# Patient Record
Sex: Female | Born: 1995 | Race: Black or African American | Hispanic: No | Marital: Single | Smoking: Never smoker
Health system: Southern US, Community
[De-identification: ages and names within clinical notes are randomized; demographics above are authoritative.]

## PROBLEM LIST (undated history)

## (undated) HISTORY — PX: BREAST SURGERY: SHX581

---

## 2018-04-27 ENCOUNTER — Encounter (HOSPITAL_BASED_OUTPATIENT_CLINIC_OR_DEPARTMENT_OTHER): Payer: Self-pay

## 2018-04-27 ENCOUNTER — Other Ambulatory Visit: Payer: Self-pay

## 2018-04-27 ENCOUNTER — Emergency Department (HOSPITAL_BASED_OUTPATIENT_CLINIC_OR_DEPARTMENT_OTHER)
Admission: EM | Admit: 2018-04-27 | Discharge: 2018-04-28 | Disposition: A | Discharge status: Home / Self Care | Payer: Self-pay | Attending: Emergency Medicine | Admitting: Emergency Medicine

## 2018-04-27 DIAGNOSIS — O219 Vomiting of pregnancy, unspecified: Secondary | ICD-10-CM | POA: Insufficient documentation

## 2018-04-27 DIAGNOSIS — R112 Nausea with vomiting, unspecified: Secondary | ICD-10-CM

## 2018-04-27 DIAGNOSIS — Z209 Contact with and (suspected) exposure to unspecified communicable disease: Secondary | ICD-10-CM | POA: Insufficient documentation

## 2018-04-27 DIAGNOSIS — Z3201 Encounter for pregnancy test, result positive: Secondary | ICD-10-CM | POA: Insufficient documentation

## 2018-04-27 DIAGNOSIS — Z3A08 8 weeks gestation of pregnancy: Secondary | ICD-10-CM | POA: Insufficient documentation

## 2018-04-27 LAB — URINALYSIS, MICROSCOPIC (REFLEX): WBC UA: NONE SEEN WBC/hpf (ref 0–5)

## 2018-04-27 LAB — URINALYSIS, ROUTINE W REFLEX MICROSCOPIC
Glucose, UA: NEGATIVE mg/dL
Hgb urine dipstick: NEGATIVE
Ketones, ur: 15 mg/dL — AB
Leukocytes, UA: NEGATIVE
NITRITE: NEGATIVE
PH: 6 (ref 5.0–8.0)
Protein, ur: 30 mg/dL — AB
SPECIFIC GRAVITY, URINE: 1.025 (ref 1.005–1.030)

## 2018-04-27 LAB — PREGNANCY, URINE: Preg Test, Ur: POSITIVE — AB

## 2018-04-27 NOTE — ED Triage Notes (Addendum)
C/o n/v x 4 days-LMP 9/25- pos home preg test this time-NAD-steady gait

## 2018-04-28 MED ORDER — ONDANSETRON 4 MG PO TBDP
4.0000 mg | ORAL_TABLET | Freq: Once | ORAL | Status: AC
  Administered 2018-04-28: 4 mg via ORAL
  Filled 2018-04-28: qty 1

## 2018-04-28 MED ORDER — METOCLOPRAMIDE HCL 5 MG PO TABS: 5.0000 mg | ORAL_TABLET | Freq: Four times a day (QID) | ORAL | 0 refills | Status: AC | PRN

## 2018-04-28 NOTE — ED Provider Notes (Signed)
[facility] EMERGENCY DEPARTMENT Provider Note  CSN: [id number] Arrival date & time: 04/27/18 2121  Chief Complaint(s) Emesis  HPI Glenda Lopez is a 22 y.o. female G3P1 (1 premature and 1 miscarrage) at [redacted]w[redacted]d by LMP.  The history is provided by the patient.  Emesis   This is a recurrent problem. Episode onset: 2 weeks. The problem occurs 5 to 10 times per day. The problem has been gradually worsening. The emesis has an appearance of stomach contents. There has been no fever. Associated symptoms include URI (resolved 2 weeks ago). Pertinent negatives include no abdominal pain, no chills, no cough, no diarrhea, no fever, no headaches and no myalgias. Risk factors include ill contacts.    Past Medical History History reviewed. No pertinent past medical history. There are no active problems to display for this patient.  Home Medication(s) Prior to Admission medications   Medication Sig Start Date End Date Taking? Authorizing Provider  metoCLOPramide (REGLAN) 5 MG tablet Take 1 tablet (5 mg total) by mouth every 6 (six) hours as needed for up to 10 days for nausea or vomiting. 04/28/18 05/08/18  Nira Conn, MD                                                                                                                                    Past Surgical History Past Surgical History:  Procedure Laterality Date  . BREAST SURGERY     Family History No family history on file.  Social History Social History   Tobacco Use  . Smoking status: Never Smoker  . Smokeless tobacco: Never Used  Substance Use Topics  . Alcohol use: Never    Frequency: Never  . Drug use: Never   Allergies Patient has no known allergies.  Review of Systems Review of Systems  Constitutional: Negative for chills and fever.  Respiratory: Negative for cough.   Gastrointestinal: Positive for vomiting. Negative for abdominal pain and diarrhea.  Musculoskeletal: Negative for myalgias.    Neurological: Negative for headaches.   All other systems are reviewed and are negative for acute change except as noted in the HPI  Physical Exam Vital Signs  I have reviewed the triage vital signs BP 116/66 (BP Location: Right Arm)   Pulse 73   Temp 98.9 F (37.2 C) (Oral)   Resp 16   Ht 5\' 5"  (1.651 m)   Wt 72.1 kg   LMP 02/25/2018   SpO2 100%   BMI 26.46 kg/m   Physical Exam  Constitutional: She is oriented to person, place, and time. She appears well-developed and well-nourished. No distress.  HENT:  Head: Normocephalic and atraumatic.  Right Ear: External ear normal.  Left Ear: External ear normal.  Nose: Nose normal.  Eyes: Conjunctivae and EOM are normal. No scleral icterus.  Neck: Normal range of motion and phonation normal.  Cardiovascular: Normal rate and regular rhythm.  Pulmonary/Chest: Effort normal. No stridor. No  respiratory distress.  Abdominal: She exhibits no distension. There is no tenderness. There is no rigidity, no rebound and no guarding.  Musculoskeletal: Normal range of motion. She exhibits no edema.  Neurological: She is alert and oriented to person, place, and time.  Skin: She is not diaphoretic.  Psychiatric: She has a normal mood and affect. Her behavior is normal.  Vitals reviewed.   ED Results and Treatments Labs (all labs ordered are listed, but only abnormal results are displayed) Labs Reviewed  URINALYSIS, ROUTINE W REFLEX MICROSCOPIC - Abnormal; Notable for the following components:      Result Value   Bilirubin Urine MODERATE (*)    Ketones, ur 15 (*)    Protein, ur 30 (*)    All other components within normal limits  PREGNANCY, URINE - Abnormal; Notable for the following components:   Preg Test, Ur POSITIVE (*)    All other components within normal limits  URINALYSIS, MICROSCOPIC (REFLEX) - Abnormal; Notable for the following components:   Bacteria, UA RARE (*)    All other components within normal limits                                                                                                                          EKG  EKG Interpretation  Date/Time:    Ventricular Rate:    PR Interval:    QRS Duration:   QT Interval:    QTC Calculation:   R Axis:     Text Interpretation:        Radiology No results found. Pertinent labs & imaging results that were available during my care of the patient were reviewed by me and considered in my medical decision making (see chart for details).  Medications Ordered in ED Medications  ondansetron (ZOFRAN-ODT) disintegrating tablet 4 mg (4 mg Oral Given 04/28/18 0019)                                                                                                                                    Procedures Procedures  (including critical care time)  Medical Decision Making / ED Course I have reviewed the nursing notes for this encounter and the patient's prior records (if available in EHR or on provided paperwork).    First trimester pregnancy at approximately 8 weeks and 6 days by last menstrual period.  Abdomen benign.  UA without evidence of infection.  Possibly pregnancy related versus viral process.  Doubt serious intra-abdominal inflammatory/infectious process.  Treated with antiemetics and able to tolerate oral intake.  The patient appears reasonably screened and/or stabilized for discharge and I doubt any other medical condition or other [facility] requiring further screening, evaluation, or treatment in the ED at this time prior to discharge.  The patient is safe for discharge with strict return precautions.   Final Clinical Impression(s) / ED Diagnoses Final diagnoses:  Nausea and vomiting in adult  [redacted] weeks gestation of pregnancy    Disposition: Discharge  Condition: Good  I have discussed the results, Dx and Tx plan with the patient who expressed understanding and agree(s) with the plan. Discharge instructions discussed at great length. The  patient was given strict return precautions who verbalized understanding of the instructions. No further questions at time of discharge.    ED Discharge Orders         Ordered    metoCLOPramide (REGLAN) 5 MG tablet  Every 6 hours PRN     04/28/18 0127           Follow Up: Obstetrician  Schedule an appointment as soon as possible for a visit  for prenatal care     This chart was dictated using voice recognition software.  Despite best efforts to proofread,  errors can occur which can change the documentation meaning.   Nira Conn, MD 04/28/18 [phone]

## 2018-04-28 NOTE — ED Notes (Signed)
Gave patient gingerale for oral hydration.

## 2018-04-30 ENCOUNTER — Emergency Department (HOSPITAL_BASED_OUTPATIENT_CLINIC_OR_DEPARTMENT_OTHER)
Admission: EM | Admit: 2018-04-30 | Discharge: 2018-04-30 | Disposition: A | Discharge status: Home / Self Care | Payer: Self-pay | Attending: Emergency Medicine | Admitting: Emergency Medicine

## 2018-04-30 ENCOUNTER — Other Ambulatory Visit: Payer: Self-pay

## 2018-04-30 ENCOUNTER — Encounter (HOSPITAL_BASED_OUTPATIENT_CLINIC_OR_DEPARTMENT_OTHER): Payer: Self-pay | Admitting: Emergency Medicine

## 2018-04-30 DIAGNOSIS — R102 Pelvic and perineal pain: Secondary | ICD-10-CM | POA: Insufficient documentation

## 2018-04-30 DIAGNOSIS — O4691 Antepartum hemorrhage, unspecified, first trimester: Secondary | ICD-10-CM | POA: Insufficient documentation

## 2018-04-30 DIAGNOSIS — Z3A08 8 weeks gestation of pregnancy: Secondary | ICD-10-CM | POA: Insufficient documentation

## 2018-04-30 DIAGNOSIS — O4690 Antepartum hemorrhage, unspecified, unspecified trimester: Secondary | ICD-10-CM

## 2018-04-30 LAB — CBC
HEMATOCRIT: 35.4 % — AB (ref 36.0–46.0)
Hemoglobin: 10.9 g/dL — ABNORMAL LOW (ref 12.0–15.0)
MCH: 26.5 pg (ref 26.0–34.0)
MCHC: 30.8 g/dL (ref 30.0–36.0)
MCV: 86.1 fL (ref 80.0–100.0)
PLATELETS: 225 10*3/uL (ref 150–400)
RBC: 4.11 MIL/uL (ref 3.87–5.11)
RDW: 15.8 % — AB (ref 11.5–15.5)
WBC: 6.7 10*3/uL (ref 4.0–10.5)
nRBC: 0 % (ref 0.0–0.2)

## 2018-04-30 LAB — WET PREP, GENITAL
Clue Cells Wet Prep HPF POC: NONE SEEN
SPERM: NONE SEEN
Trich, Wet Prep: NONE SEEN
YEAST WET PREP: NONE SEEN

## 2018-04-30 LAB — HCG, QUANTITATIVE, PREGNANCY: hCG, Beta Chain, Quant, S: 272320 m[IU]/mL — ABNORMAL HIGH (ref <5)

## 2018-04-30 NOTE — ED Triage Notes (Signed)
Patient states she woke around 0100 and noticed vaginal bleeding after using the bathroom; co abd cramping.

## 2018-04-30 NOTE — ED Provider Notes (Signed)
[facility] EMERGENCY DEPARTMENT Provider Note   CSN: [id number] Arrival date & time: 04/30/18  0201     History   Chief Complaint Chief Complaint  Patient presents with  . Vaginal Bleeding    HPI Glenda Lopez is a 22 y.o. female.  The history is provided by the patient.  Vaginal Bleeding  Primary symptoms include vaginal bleeding.  Primary symptoms include no dysuria. There has been no fever. This is a new problem. The current episode started 3 to 5 hours ago. The problem has been gradually improving. Associated symptoms include abdominal pain. Pertinent negatives include no vomiting. She has tried nothing for the symptoms. There is no concern regarding sexually transmitted diseases.  Patient presents for abdominal pain and cramping.  She reports while she was at work tonight, she has some cramping in some vaginal bleeding.  No fevers or vomiting.  No dysuria.  She feels that her bleeding is improving.  She is known pregnant, approximately 8 weeks by LMP.  She is a G3, P1.  She does not have a local OB as she just moved here  PMH-none Past Surgical History:  Procedure Laterality Date  . BREAST SURGERY       OB History   None      Home Medications    Prior to Admission medications   Medication Sig Start Date End Date Taking? Authorizing Provider  metoCLOPramide (REGLAN) 5 MG tablet Take 1 tablet (5 mg total) by mouth every 6 (six) hours as needed for up to 10 days for nausea or vomiting. 04/28/18 05/08/18  Nira Conn, MD    Family History No family history on file.  Social History Social History   Tobacco Use  . Smoking status: Never Smoker  . Smokeless tobacco: Never Used  Substance Use Topics  . Alcohol use: Never    Frequency: Never  . Drug use: Never     Allergies   Patient has no known allergies.   Review of Systems Review of Systems  Constitutional: Negative for fever.  Gastrointestinal: Positive for abdominal pain.  Negative for vomiting.  Genitourinary: Positive for vaginal bleeding. Negative for dysuria.  All other systems reviewed and are negative.    Physical Exam Updated Vital Signs BP 132/77 (BP Location: Right Arm)   Pulse 88   Temp 98.2 F (36.8 C) (Oral)   Resp 18   Ht 1.651 m (5\' 5" )   Wt 72.1 kg   LMP 02/25/2018   SpO2 100%   BMI 26.45 kg/m   Physical Exam CONSTITUTIONAL: Well developed/well nourished HEAD: Normocephalic/atraumatic EYES: EOMI/PERRL ENMT: Mucous membranes moist NECK: supple no meningeal signs SPINE/BACK:entire spine nontender CV: S1/S2 noted, no murmurs/rubs/gallops noted LUNGS: Lungs are clear to auscultation bilaterally, no apparent distress ABDOMEN: soft, nontender, no rebound or guarding, bowel sounds noted throughout abdomen GU:no cva tenderness,, no CMT, no vaginal bleeding, no vaginal discharge, no products of conception, nurse chaperone present for exam NEURO: Pt is awake/alert/appropriate, moves all extremitiesx4.  No facial droop.   EXTREMITIES: pulses normal/equal, full ROM SKIN: warm, color normal PSYCH: no abnormalities of mood noted, alert and oriented to situation   ED Treatments / Results  Labs (all labs ordered are listed, but only abnormal results are displayed) Labs Reviewed  WET PREP, GENITAL - Abnormal; Notable for the following components:      Result Value   WBC, Wet Prep HPF POC FEW (*)    All other components within normal limits  CBC - Abnormal; Notable  for the following components:   Hemoglobin 10.9 (*)    HCT 35.4 (*)    RDW 15.8 (*)    All other components within normal limits  HCG, QUANTITATIVE, PREGNANCY - Abnormal; Notable for the following components:   hCG, Beta Chain, Quant, S 272,320 (*)    All other components within normal limits  HIV ANTIBODY (ROUTINE TESTING W REFLEX)  GC/CHLAMYDIA PROBE AMP ([place]) NOT AT [facility]    EKG None  Radiology No results found.  Procedures Procedures  EMERGENCY  DEPARTMENT US PREGNANCY "Study: Limited Ultrasound of the Pelvis for Pregnancy"  INDICATIONS:Pregnancy(required) and Vaginal bleeding Multiple views of the uterus and pelvic cavity were obtained in real-time with a multi-frequency probe.  APPROACH:Transabdominal  PERFORMED BY: Myself IMAGES ARCHIVED?: Yes LIMITATIONS: Emergent procedure PREGNANCY FREE FLUID: None ADNEXAL FINDINGS: GESTATIONAL AGE, ESTIMATE: 8-9 weeks LMP FETAL HEART RATE: 115 INTERPRETATION: Yolk sac noted and Fetal heart activity seen      Medications Ordered in ED Medications - No data to display   Initial Impression / Assessment and Plan / ED Course  I have reviewed the triage vital signs and the nursing notes.  Pertinent labs  results that were available during my care of the patient were reviewed by me and considered in my medical decision making (see chart for details).     Patient presented with small amount of vaginal bleeding abdominal cramping.  Her symptoms have resolved at time of exam.  She was resting comfortably, sleeping on arrival to room.  Bedside ultrasound that was limited revealed IUP with fetal heart activity.  No other focal abdominal tenderness.  I feel she is appropriate for outpatient management.  She is been given numbers for OB/GYN care. She does have history of miscarriage previously, I discussed that a miscarriage is still possible and close follow-up with OB is in order  Final Clinical Impressions(s) / ED Diagnoses   Final diagnoses:  Vaginal bleeding in pregnancy    ED Discharge Orders    None       Zadie RhineWickline, Marquis Down, MD 04/30/18 [phone]

## 2018-05-01 LAB — HIV ANTIBODY (ROUTINE TESTING W REFLEX): HIV Screen 4th Generation wRfx: NONREACTIVE

## 2018-05-01 LAB — GC/CHLAMYDIA PROBE AMP (~~LOC~~) NOT AT ARMC
Chlamydia: POSITIVE — AB
NEISSERIA GONORRHEA: NEGATIVE

## 2018-06-18 ENCOUNTER — Encounter: Payer: Self-pay | Admitting: Family Medicine

## 2019-12-03 ENCOUNTER — Encounter (HOSPITAL_BASED_OUTPATIENT_CLINIC_OR_DEPARTMENT_OTHER): Payer: Self-pay | Admitting: *Deleted

## 2019-12-03 ENCOUNTER — Emergency Department (HOSPITAL_BASED_OUTPATIENT_CLINIC_OR_DEPARTMENT_OTHER): Payer: Medicaid Other

## 2019-12-03 ENCOUNTER — Emergency Department (HOSPITAL_BASED_OUTPATIENT_CLINIC_OR_DEPARTMENT_OTHER)
Admission: EM | Admit: 2019-12-03 | Discharge: 2019-12-03 | Disposition: A | Discharge status: Home / Self Care | Payer: Medicaid Other | Attending: Emergency Medicine | Admitting: Emergency Medicine

## 2019-12-03 ENCOUNTER — Other Ambulatory Visit: Payer: Self-pay

## 2019-12-03 DIAGNOSIS — Y939 Activity, unspecified: Secondary | ICD-10-CM | POA: Insufficient documentation

## 2019-12-03 DIAGNOSIS — S51811A Laceration without foreign body of right forearm, initial encounter: Secondary | ICD-10-CM | POA: Diagnosis not present

## 2019-12-03 DIAGNOSIS — Y92009 Unspecified place in unspecified non-institutional (private) residence as the place of occurrence of the external cause: Secondary | ICD-10-CM | POA: Insufficient documentation

## 2019-12-03 DIAGNOSIS — Y999 Unspecified external cause status: Secondary | ICD-10-CM | POA: Diagnosis not present

## 2019-12-03 DIAGNOSIS — S61212A Laceration without foreign body of right middle finger without damage to nail, initial encounter: Secondary | ICD-10-CM | POA: Insufficient documentation

## 2019-12-03 DIAGNOSIS — S59911A Unspecified injury of right forearm, initial encounter: Secondary | ICD-10-CM | POA: Diagnosis present

## 2019-12-03 DIAGNOSIS — W25XXXA Contact with sharp glass, initial encounter: Secondary | ICD-10-CM | POA: Diagnosis not present

## 2019-12-03 MED ORDER — LIDOCAINE-EPINEPHRINE (PF) 2 %-1:200000 IJ SOLN: 10.0000 mL | Freq: Once | INTRAMUSCULAR | Status: AC

## 2019-12-03 MED ORDER — LIDOCAINE-EPINEPHRINE (PF) 2 %-1:200000 IJ SOLN
INTRAMUSCULAR | Status: AC
  Administered 2019-12-03: 10 mL via INTRADERMAL
  Filled 2019-12-03: qty 10

## 2019-12-03 MED ORDER — LIDOCAINE-EPINEPHRINE 2 %-1:100000 IJ SOLN: 1.7000 mL | Freq: Once | INTRAMUSCULAR | Status: DC

## 2019-12-03 NOTE — ED Triage Notes (Signed)
Lac to rt wrist  Door was being closed and she tried to stop it  Arm went through glass

## 2019-12-03 NOTE — Discharge Instructions (Signed)
Change the dressing on your arm once a day.  You can put some Vaseline around the wound to help it to stay dry and heal.  Try not to soak the wound and when it is ready the sutures should absorb.

## 2019-12-03 NOTE — ED Provider Notes (Signed)
[facility] EMERGENCY DEPARTMENT Provider Note   CSN: [id number] Arrival date & time: 12/03/19  1040     History Chief Complaint  Patient presents with  . Extremity Laceration    Torre Pikus is a 24 y.o. female.  Patient is a 24 year old female with no significant past medical history presenting today due to laceration of her right arm.  Patient states that she was walking behind her fianc who closed a door and her arm was sticking out and went through the glass of the door.  She has pain over the forearm and hand where her arm went through the door.  Tetanus shot is up-to-date.  No other areas of injury.  She reports some mild numbness to the fourth and fifth finger and pain with bending and straightening.  The history is provided by the patient.       History reviewed. No pertinent past medical history.  There are no problems to display for this patient.   Past Surgical History:  Procedure Laterality Date  . BREAST SURGERY       OB History   No obstetric history on file.     No family history on file.  Social History   Tobacco Use  . Smoking status: Never Smoker  . Smokeless tobacco: Never Used  Vaping Use  . Vaping Use: Never used  Substance Use Topics  . Alcohol use: Never  . Drug use: Never    Home Medications Prior to Admission medications   Medication Sig Start Date End Date Taking? Authorizing Provider  metoCLOPramide (REGLAN) 5 MG tablet Take 1 tablet (5 mg total) by mouth every 6 (six) hours as needed for up to 10 days for nausea or vomiting. 04/28/18 05/08/18  Nira Conn, MD    Allergies    Patient has no known allergies.  Review of Systems   Review of Systems  All other systems reviewed and are negative.   Physical Exam Updated Vital Signs BP (!) 116/92 (BP Location: Left Arm)   Pulse 75   Temp 98.7 F (37.1 C) (Oral)   Resp 18   Ht 5' 5.5" (1.664 m)   Wt 84.4 kg   LMP 11/21/2019 (Exact Date)   SpO2 97%    BMI 30.48 kg/m   Physical Exam Constitutional:      General: She is not in acute distress.    Appearance: Normal appearance. She is normal weight.  HENT:     Head: Normocephalic.  Eyes:     Pupils: Pupils are equal, round, and reactive to light.  Cardiovascular:     Rate and Rhythm: Normal rate.     Pulses: Normal pulses.  Pulmonary:     Effort: Pulmonary effort is normal.  Musculoskeletal:        General: Tenderness present.     Right forearm: Laceration present.       Arms:       Hands:     Comments: Full flexion and extension at the DIP, PIP and MCP joints of all 5 fingers.Hesitation with flexion and extension of the wrist due to pain  Skin:    General: Skin is warm and dry.     Capillary Refill: Capillary refill takes less than 2 seconds.  Neurological:     General: No focal deficit present.     Mental Status: She is alert. Mental status is at baseline.  Psychiatric:        Mood and Affect: Mood normal.  Behavior: Behavior normal.        Thought Content: Thought content normal.     ED Results / Procedures / Treatments   Labs (all labs ordered are listed, but only abnormal results are displayed) Labs Reviewed - No data to display  EKG None  Radiology DG Wrist Complete Right  Result Date: 12/03/2019 CLINICAL DATA:  Recent laceration to the right wrist, initial encounter EXAM: RIGHT WRIST - COMPLETE 3+ VIEW COMPARISON:  None. FINDINGS: Soft tissue irregularity is noted along the medial aspect of the distal forearm consistent with the recent injury. No radiopaque foreign body is seen. No acute fracture or dislocation is noted. IMPRESSION: Soft tissue irregularity without acute bony abnormality. No foreign body is seen. Electronically Signed   By: Alcide Clever M.D.   On: 12/03/2019 11:43   DG Finger Middle Right  Result Date: 12/03/2019 CLINICAL DATA:  Laceration, foreign body EXAM: RIGHT MIDDLE FINGER 2+V COMPARISON:  None. FINDINGS: Technical note: Ordering  indication is radiographs of the right middle finger. Images submitted for review are labeled left. There is no evidence of fracture or dislocation. There is no evidence of arthropathy or other focal bone abnormality. Soft tissues are unremarkable. IMPRESSION: 1. Technical note: Ordering indication is radiographs of the right middle finger. Images submitted for review are labeled left. 2. No fracture or dislocation of the third digit. No radiopaque foreign body. Electronically Signed   By: Lauralyn Primes M.D.   On: 12/03/2019 12:47    Procedures Procedures (including critical care time) LACERATION REPAIR Performed by: [facility] [facility] Authorized by: Gwyneth Sprout Consent: Verbal consent obtained. Risks and benefits: risks, benefits and alternatives were discussed Consent given by: patient Patient identity confirmed: provided demographic data Prepped and Draped in normal sterile fashion Wound explored  Laceration Location: Right ulnar forearm  Laceration Length: 4 cm  No Foreign Bodies seen or palpated  Anesthesia: local infiltration  Local anesthetic: lidocaine 2% with epinephrine  Anesthetic total: 5 ml  Irrigation method: syringe Amount of cleaning: standard  Skin closure: 4.0 Vicryl rapide  Number of sutures: 16  Technique: Simple interrupted  Patient tolerance: Patient tolerated the procedure well with no immediate complications. LACERATION REPAIR Performed by: [facility] Authorized by: Gwyneth Sprout Consent: Verbal consent obtained. Risks and benefits: risks, benefits and alternatives were discussed Consent given by: patient Patient identity confirmed: provided demographic data Prepped and Draped in normal sterile fashion Wound explored  Laceration Location: Right middle finger  Laceration Length: 0.5 cm  No Foreign Bodies seen or palpated  Anesthesia: none Irrigation method: syringe Amount of cleaning: standard  Skin closure:  dermabond   Patient tolerance: Patient tolerated the procedure well with no immediate complications.   Medications Ordered in ED Medications  lidocaine-EPINEPHrine (XYLOCAINE W/EPI) 2 %-1:100000 (with pres) injection 1.7 mL (has no administration in time range)  lidocaine-EPINEPHrine (XYLOCAINE W/EPI) 2 %-1:200000 (PF) injection (has no administration in time range)    ED Course  I have reviewed the triage vital signs and the nursing notes.  Pertinent labs & imaging results that were available during my care of the patient were reviewed by me and considered in my medical decision making (see chart for details).    MDM Rules/Calculators/A&P                          Patient is a healthy 24 year old female whose hand went through glass in the door today.  Patient has large laceration to the ulnar side  of her right forearm without evidence of tendon or ligamental injury.  Patient also has small abrasions in the wrist area and a laceration to the right middle finger.  X-rays are negative for retained foreign body.  Wound repaired as above.  Patient's tetanus shot is up-to-date.  MDM Number of Diagnoses or Management Options   Amount and/or Complexity of Data Reviewed Tests in the radiology section of CPT: ordered and reviewed Independent visualization of images, tracings, or specimens: yes  Risk of Complications, Morbidity, and/or Mortality Presenting problems: low Diagnostic procedures: low Management options: low  Patient Progress Patient progress: stable   Final Clinical Impression(s) / ED Diagnoses Final diagnoses:  Laceration of right middle finger without foreign body without damage to nail, initial encounter  Laceration of right forearm, initial encounter    Rx / DC Orders ED Discharge Orders    None       Gwyneth Sprout, MD 12/03/19 1300

## 2021-11-13 IMAGING — CR DG FINGER MIDDLE 2+V*R*
3 series · 3 of 3 positions shown · non-contrast
Comparison: None.

CLINICAL DATA: Laceration, foreign body

EXAM:
RIGHT MIDDLE FINGER 2+V

[x finger pa right]
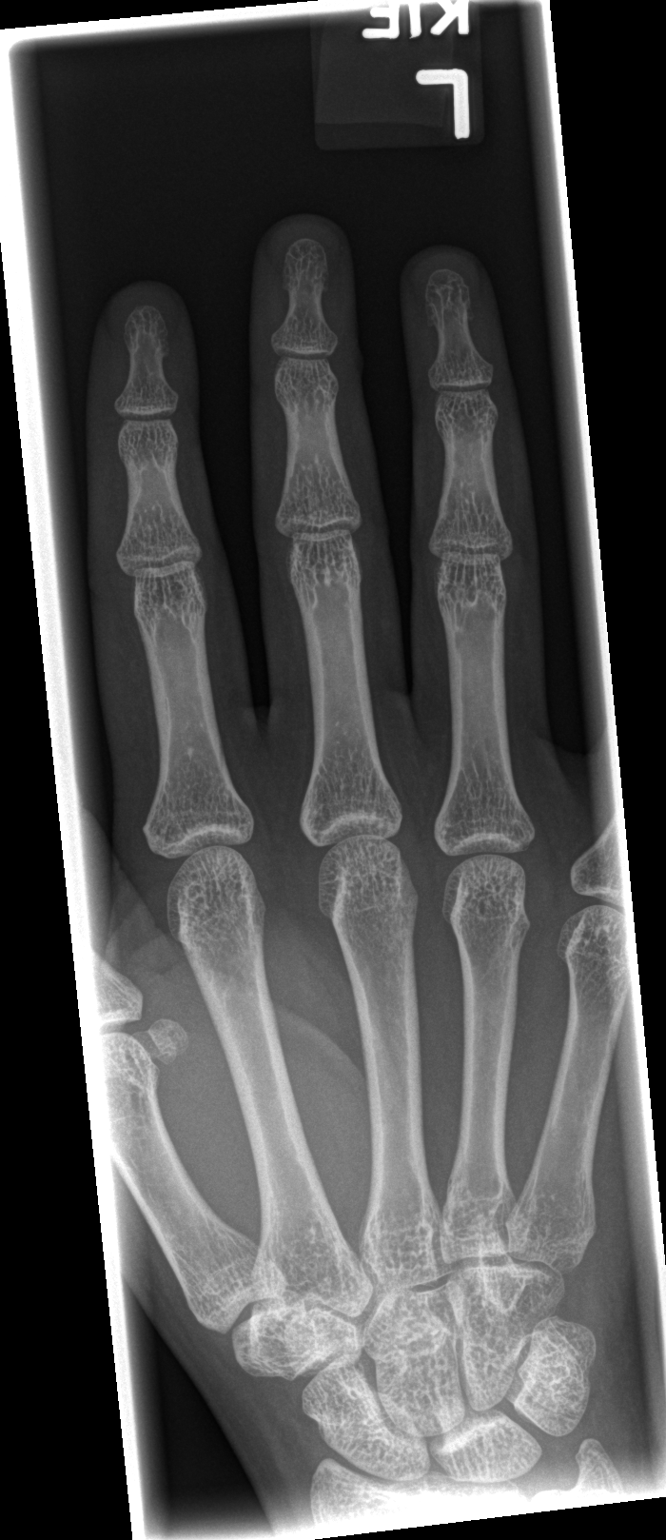

[x finger obl. right]
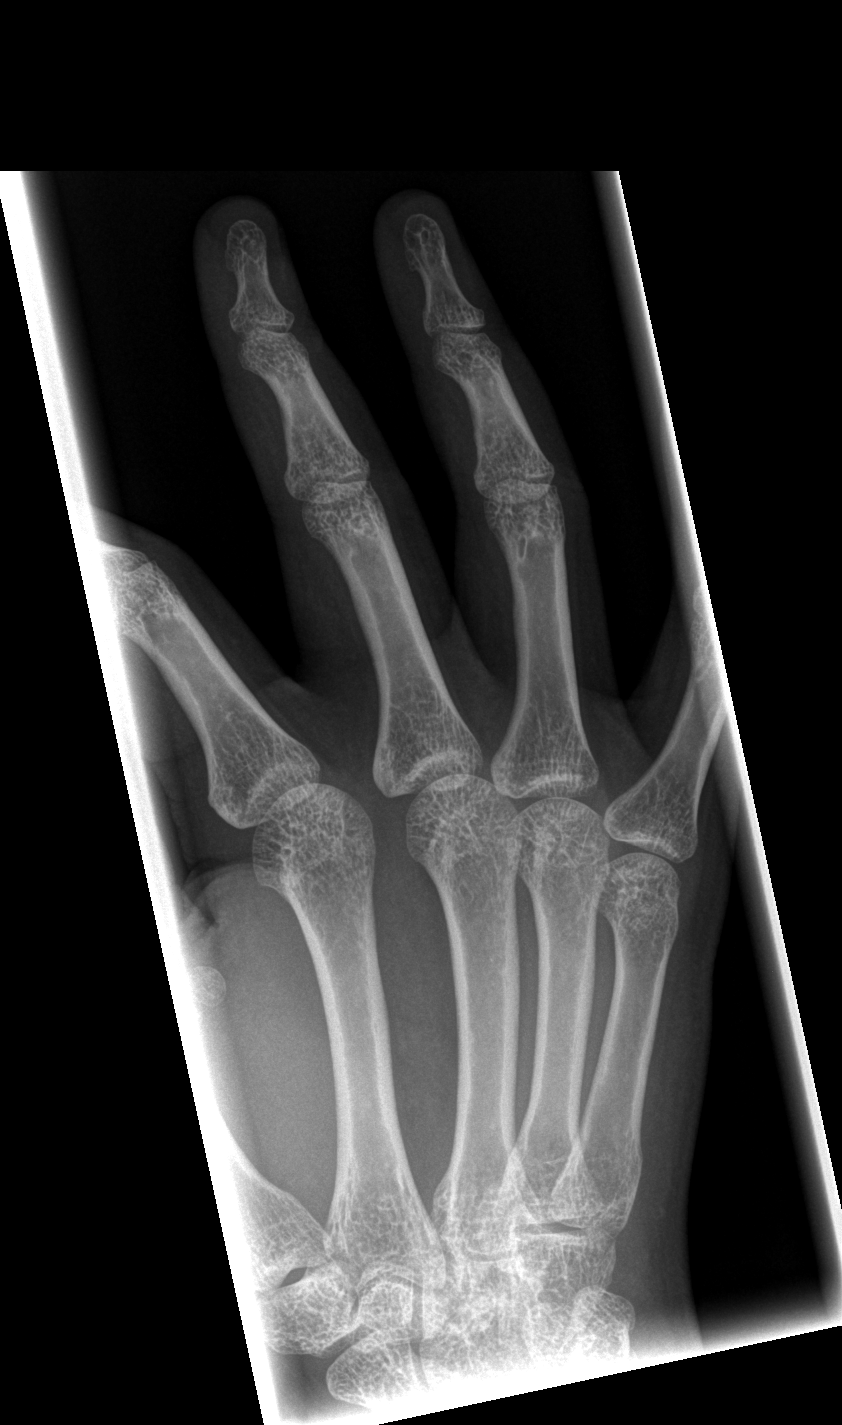

[x finger lateral right]
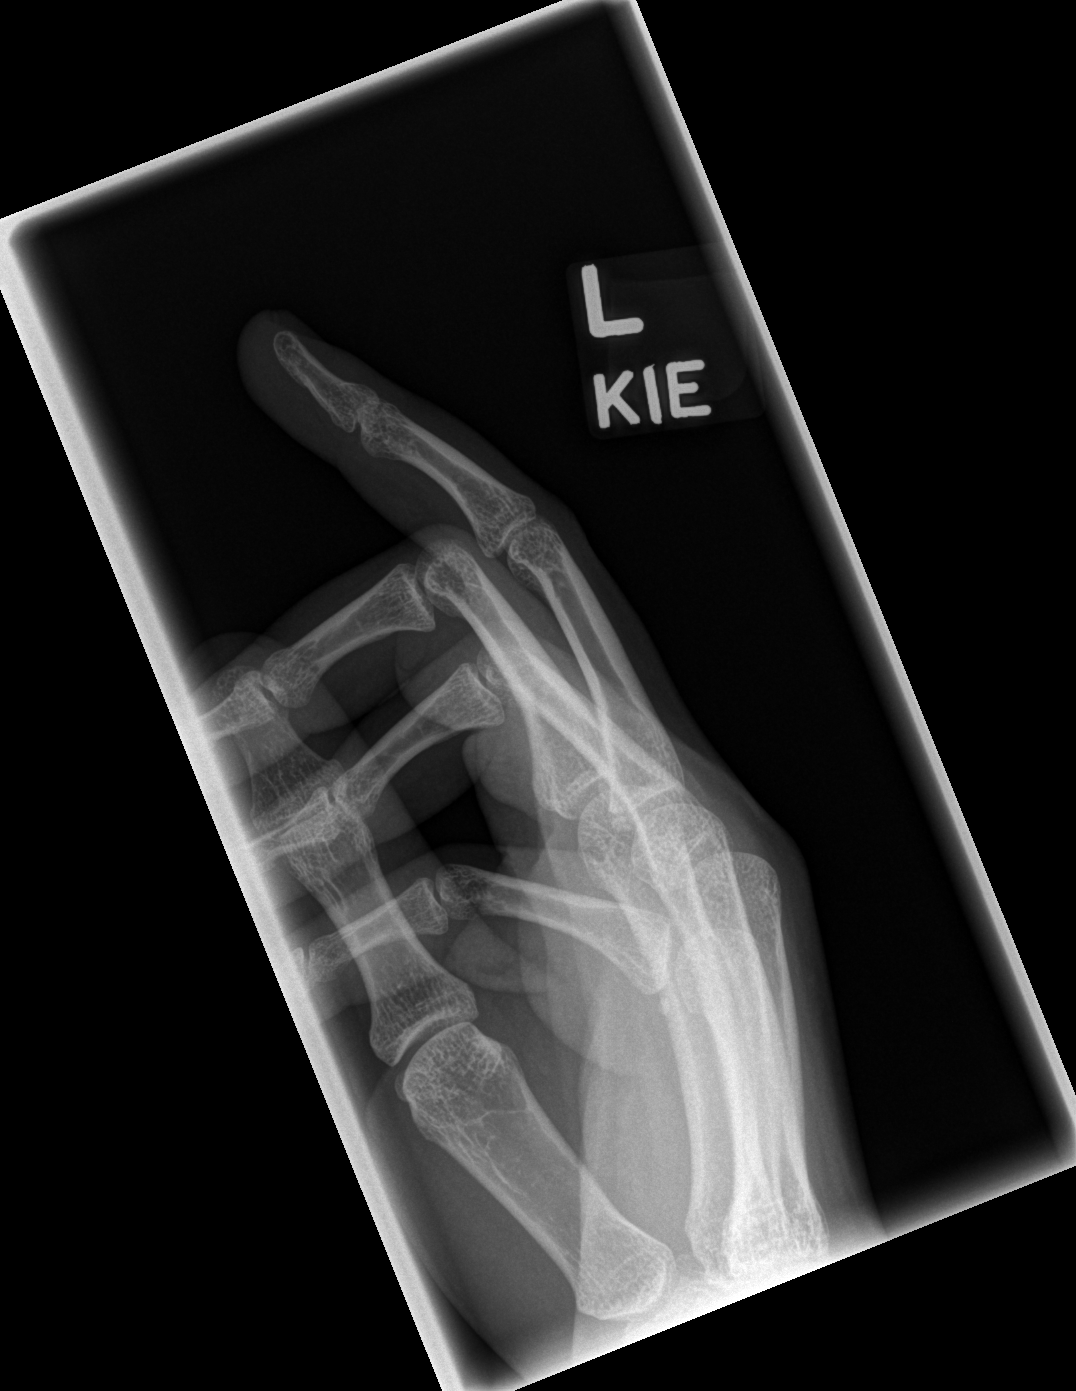

[3 of 3 positions shown; findings below may reference images not displayed]

FINDINGS: Technical note: Ordering indication is radiographs of the right
middle finger. Images submitted for review are labeled left. There
is no evidence of fracture or dislocation. There is no evidence of
arthropathy or other focal bone abnormality. Soft tissues are
unremarkable.
IMPRESSION: 1. Technical note: Ordering indication is radiographs of the right
middle finger. Images submitted for review are labeled left.

2. No fracture or dislocation of the third digit. No radiopaque
foreign body.

## 2021-11-13 IMAGING — CR DG WRIST COMPLETE 3+V*R*
4 series · 4 of 4 positions shown · non-contrast
Comparison: None.

CLINICAL DATA: Recent laceration to the right wrist, initial
encounter

EXAM:
RIGHT WRIST - COMPLETE 3+ VIEW

[x wrist pa right]
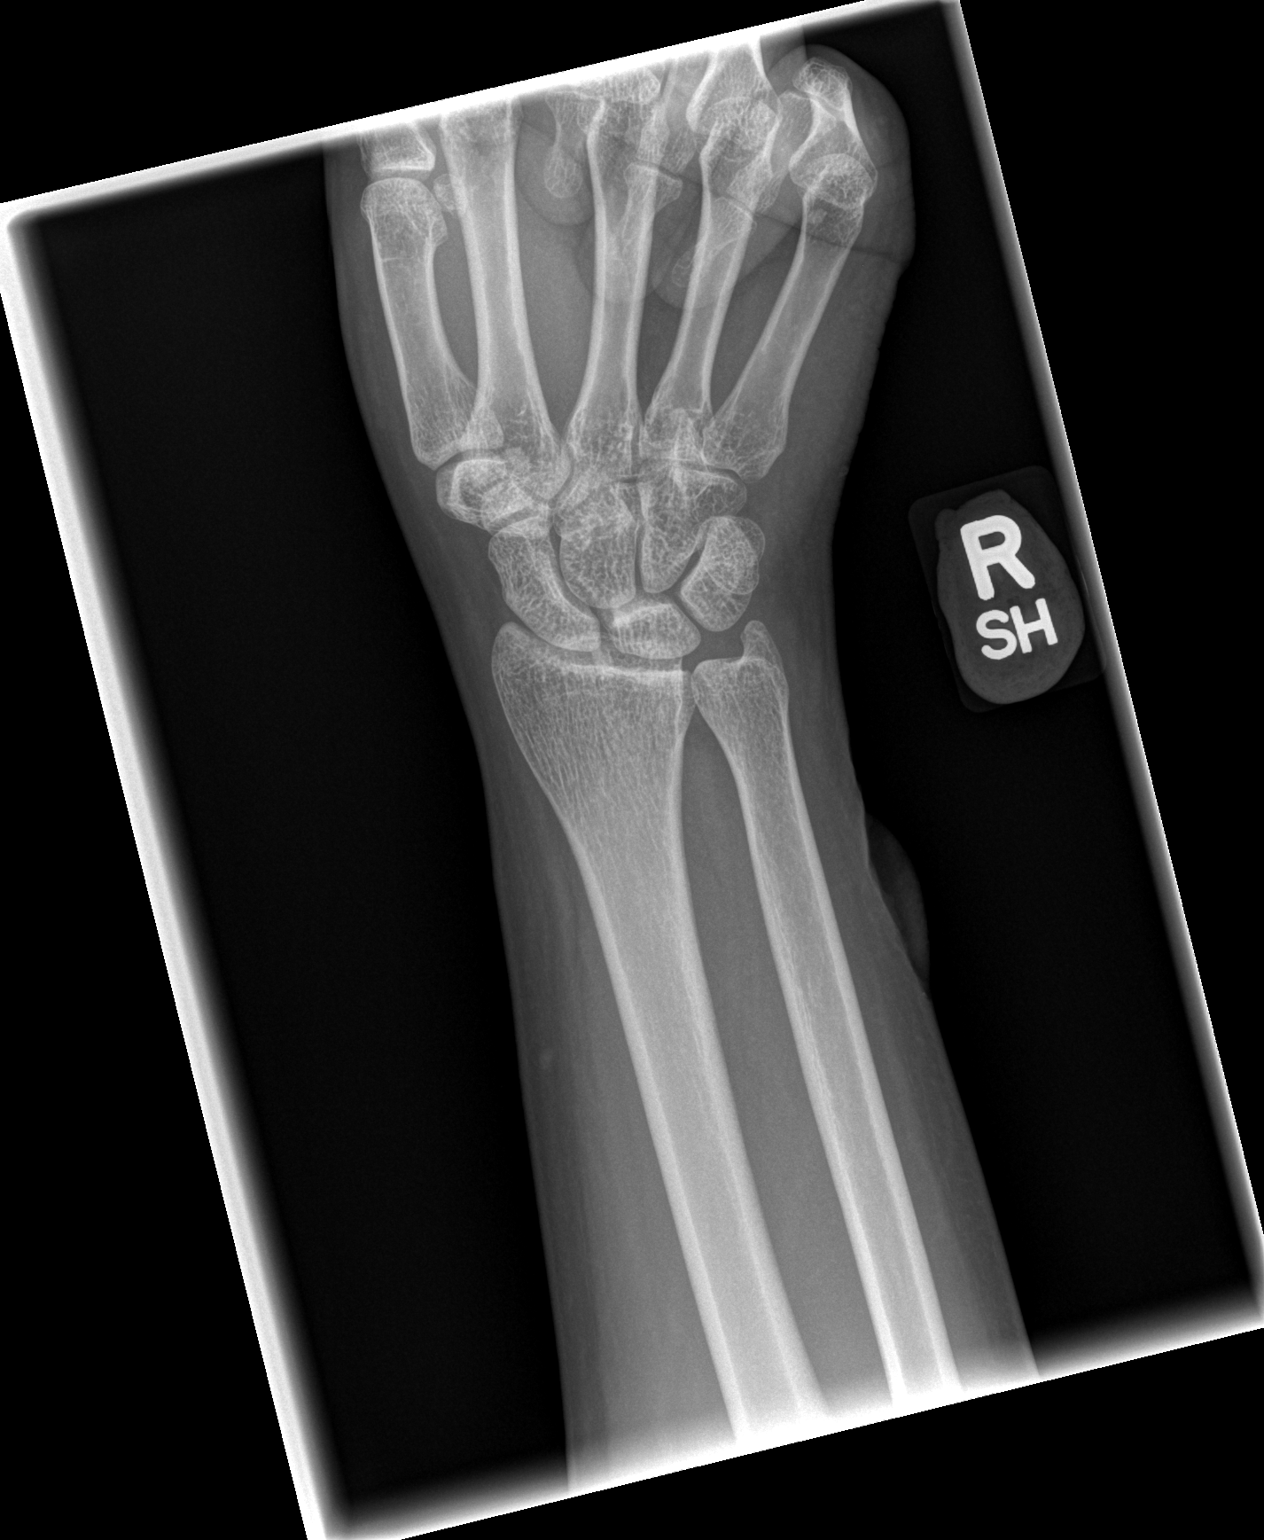

[x wrist obl right]
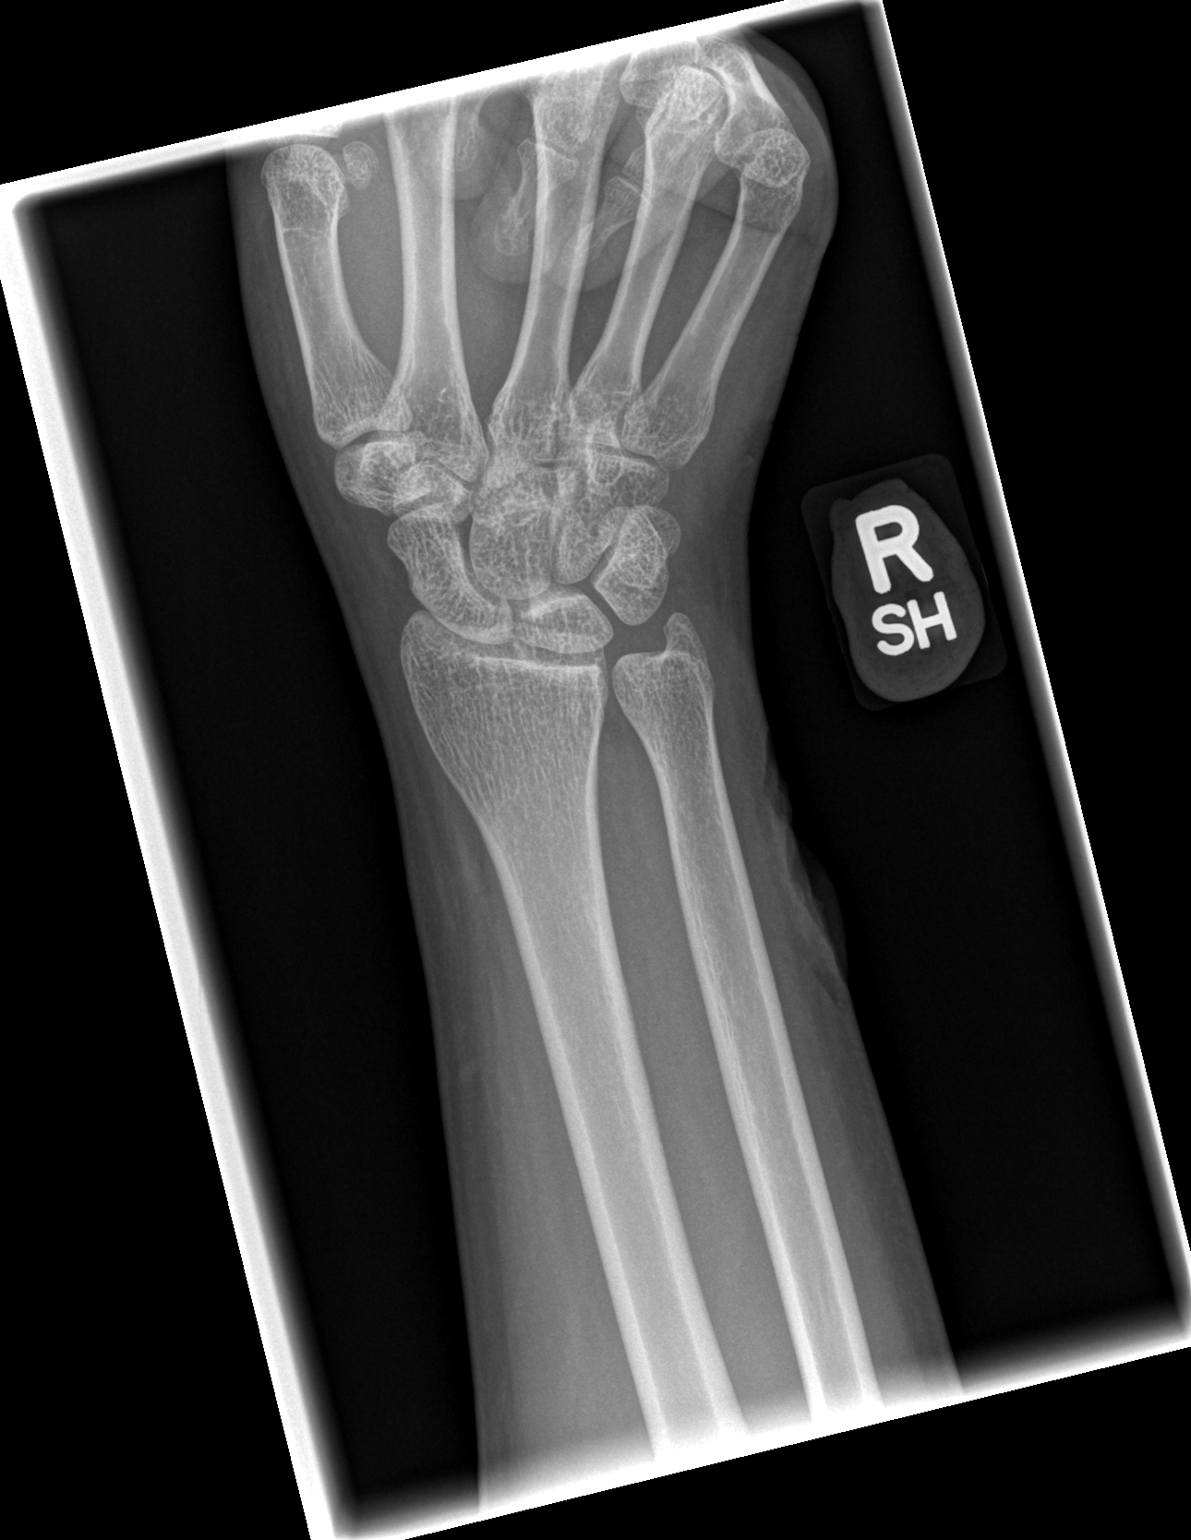

[x wrist lat right]
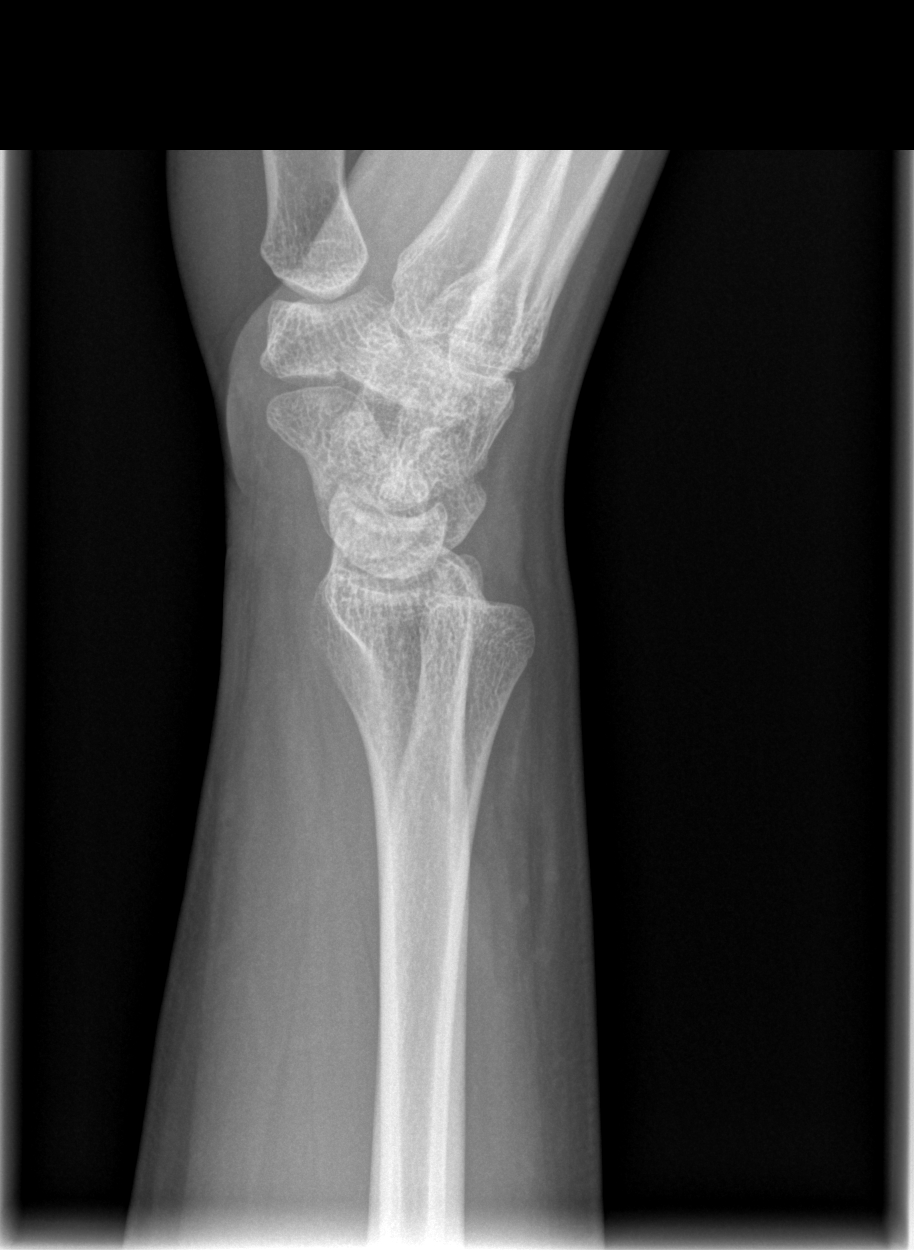

[x navicular]
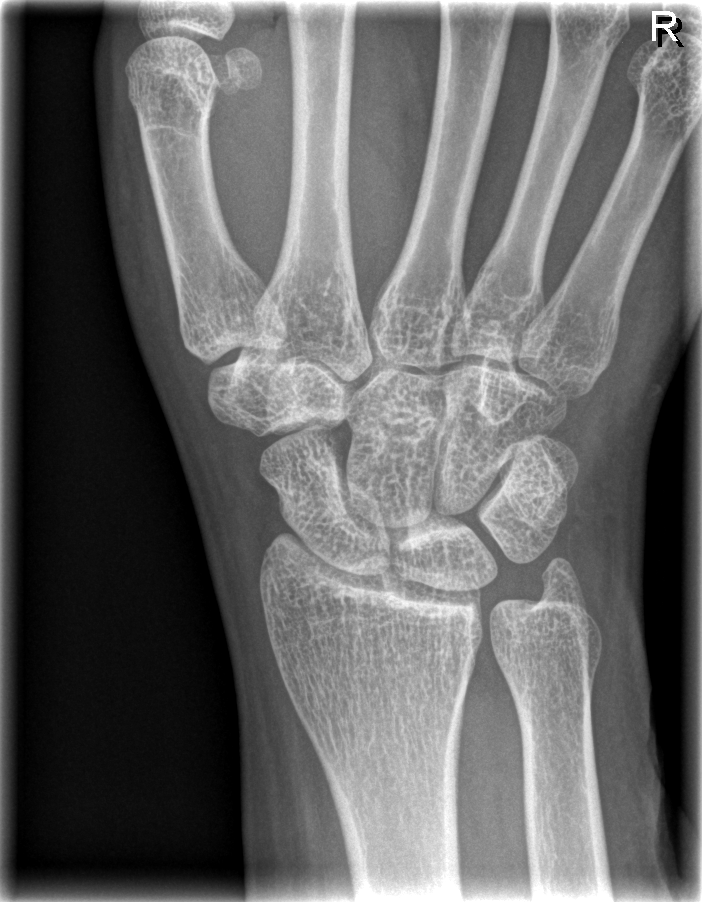

[4 of 4 positions shown; findings below may reference images not displayed]

FINDINGS: Soft tissue irregularity is noted along the medial aspect of the
distal forearm consistent with the recent injury. No radiopaque
foreign body is seen. No acute fracture or dislocation is noted.
IMPRESSION: Soft tissue irregularity without acute bony abnormality. No foreign
body is seen.

## 2021-12-19 ENCOUNTER — Encounter (HOSPITAL_BASED_OUTPATIENT_CLINIC_OR_DEPARTMENT_OTHER): Payer: Self-pay | Admitting: Emergency Medicine

## 2021-12-19 ENCOUNTER — Emergency Department (HOSPITAL_BASED_OUTPATIENT_CLINIC_OR_DEPARTMENT_OTHER)
Admission: EM | Admit: 2021-12-19 | Discharge: 2021-12-19 | Disposition: A | Discharge status: Home / Self Care | Payer: Medicaid Other | Attending: Emergency Medicine | Admitting: Emergency Medicine

## 2021-12-19 DIAGNOSIS — J020 Streptococcal pharyngitis: Secondary | ICD-10-CM

## 2021-12-19 DIAGNOSIS — J029 Acute pharyngitis, unspecified: Secondary | ICD-10-CM | POA: Diagnosis present

## 2021-12-19 LAB — GROUP A STREP BY PCR: Group A Strep by PCR: DETECTED — AB

## 2021-12-19 MED ORDER — DEXAMETHASONE 4 MG PO TABS
10.0000 mg | ORAL_TABLET | Freq: Once | ORAL | Status: AC
  Administered 2021-12-19: 10 mg via ORAL
  Filled 2021-12-19: qty 3

## 2021-12-19 MED ORDER — ACETAMINOPHEN 325 MG PO TABS
650.0000 mg | ORAL_TABLET | Freq: Once | ORAL | Status: DC
  Filled 2021-12-19: qty 2

## 2021-12-19 MED ORDER — PENICILLIN G BENZATHINE 1200000 UNIT/2ML IM SUSY
1.2000 10*6.[IU] | PREFILLED_SYRINGE | Freq: Once | INTRAMUSCULAR | Status: AC
  Administered 2021-12-19: 1.2 10*6.[IU] via INTRAMUSCULAR
  Filled 2021-12-19: qty 2

## 2021-12-19 NOTE — Discharge Instructions (Addendum)
We discussed your diagnosis of strep pharyngitis.  Please follow-up with your primary care doctor as needed.  Please return to the emergency department if you develop new or worsening symptoms including but not limited to fever, difficulty swallowing, vomiting.

## 2021-12-19 NOTE — ED Provider Notes (Signed)
[facility] EMERGENCY DEPARTMENT Provider Note   CSN: [id number] Arrival date & time: 12/19/21  0737     History  Chief Complaint  Patient presents with   Sore Throat    Glenda Lopez is a 26 y.o. female with history of generalized anxiety disorder, PTSD, bipolar disorder that presents with 3 days of sore throat. She states that she has had difficulty eating since yesterday due to the pain from swallowing but is still tolerating fluids.  Patient has also been experiencing nausea, rhinorrhea, nasal congestion, muscle aches, generalized headache, and lightheadedness over this period of time. She states that she developed fever and chills last night and measured a temperature of 102 F.  The patient states that she tried TheraFlu and Mucinex at home without relief of her symptoms.    Patient denies recent sick contacts but states that she just started school. The patient denies ear pain, cough, diaphoresis, abdominal pain, vomiting, diarrhea, chest pain, shortness of breath, frequency, dysuria.  The history is provided by the patient.  Sore Throat Associated symptoms include headaches. Pertinent negatives include no chest pain, no abdominal pain and no shortness of breath.       Home Medications Prior to Admission medications   Medication Sig Start Date End Date Taking? Authorizing Provider  metoCLOPramide (REGLAN) 5 MG tablet Take 1 tablet (5 mg total) by mouth every 6 (six) hours as needed for up to 10 days for nausea or vomiting. 04/28/18 05/08/18  Nira Conn, MD      Allergies    Patient has no known allergies.    Review of Systems   Review of Systems  Constitutional:  Positive for chills and fever. Negative for diaphoresis.  HENT:  Positive for rhinorrhea, sore throat and trouble swallowing. Negative for ear pain.        Endorses nasal congestion.   Respiratory:  Negative for cough and shortness of breath.   Cardiovascular:  Negative for chest pain  and leg swelling.  Gastrointestinal:  Positive for nausea. Negative for abdominal pain, diarrhea and vomiting.  Genitourinary:  Negative for dysuria and frequency.  Musculoskeletal:  Positive for myalgias.  Neurological:  Positive for light-headedness and headaches.    Physical Exam Updated Vital Signs There were no vitals taken for this visit. Physical Exam Vitals reviewed: Afebrile..  Constitutional:      General: She is not in acute distress. HENT:     Mouth/Throat:     Mouth: Mucous membranes are moist. No oral lesions.     Pharynx: No oropharyngeal exudate.     Tonsils: No tonsillar exudate or tonsillar abscesses.     Comments: Diffuse erythema of the posterior oropharynx.  Neck:     Comments: Tenderness to palpation of the neck just below the angle of the mandible bilaterally.  Cardiovascular:     Rate and Rhythm: Normal rate and regular rhythm.     Heart sounds: No murmur heard.    No friction rub. No gallop.  Pulmonary:     Effort: Pulmonary effort is normal.     Breath sounds: No wheezing, rhonchi or rales.  Abdominal:     General: There is no distension.     Palpations: Abdomen is soft.     Tenderness: There is no abdominal tenderness.  Musculoskeletal:     Cervical back: Neck supple.  Lymphadenopathy:     Cervical: Cervical adenopathy present.  Skin:    General: Skin is warm and dry.  Neurological:  Mental Status: She is alert.     ED Results / Procedures / Treatments   Labs (all labs ordered are listed, but only abnormal results are displayed) Labs Reviewed - No data to display  EKG None  Radiology No results found.  Procedures Procedures    Medications Ordered in ED Medications - No data to display  ED Course/ Medical Decision Making/ A&P                           Medical Decision Making Ayaat Jansma is a 26 y.o. female with history of generalized anxiety disorder, PTSD, bipolar disorder that presents with 3 days of sore throat,  rhinorrhea, nasal congestion, nausea, muscle aches, generalized headache, lightheadedness, and 1 day of fever and chills. Differential diagnosis includes but is not limited to streptococcal pharyngitis, infectious mononucleosis. Will order Group A strep PCR to assess for potential streptococcal pharyngitis. Will order acetaminophen for pain.  Patient discussed with Dr. Anitra Lauth.    Risk OTC drugs. Prescription drug management.  8:31 AM Patients Strep PCR is positive.  Updated the patient on her results. Will administer IM penicillin and oral dexamethasone while here in the ED. Discussed with patient plan to replace her toothbrush as to not re-infect herself. Discussed with patient plan to follow-up with her primary care doctor.  Discussed with patient plan to return to the emergency department if she develops new or worsening symptoms including but not limited to fever, difficulty swallowing, vomiting.  Patient agrees with the plan.         Final Clinical Impression(s) / ED Diagnoses Final diagnoses:  None    Rx / DC Orders ED Discharge Orders     None         Porschia Willbanks, Gaylyn Cheers, MD 12/19/21 [id number]    Gwyneth Sprout, MD 12/19/21 1535

## 2021-12-19 NOTE — ED Triage Notes (Signed)
Pt reports sore throat for past 3 days accompanied by chills.

## 2022-08-14 ENCOUNTER — Other Ambulatory Visit: Payer: Self-pay

## 2022-08-14 ENCOUNTER — Encounter (HOSPITAL_BASED_OUTPATIENT_CLINIC_OR_DEPARTMENT_OTHER): Payer: Self-pay

## 2022-08-14 DIAGNOSIS — R42 Dizziness and giddiness: Secondary | ICD-10-CM | POA: Diagnosis not present

## 2022-08-14 DIAGNOSIS — G43909 Migraine, unspecified, not intractable, without status migrainosus: Secondary | ICD-10-CM | POA: Diagnosis present

## 2022-08-14 DIAGNOSIS — Z5321 Procedure and treatment not carried out due to patient leaving prior to being seen by health care provider: Secondary | ICD-10-CM | POA: Diagnosis not present

## 2022-08-14 DIAGNOSIS — H538 Other visual disturbances: Secondary | ICD-10-CM | POA: Insufficient documentation

## 2022-08-14 NOTE — ED Triage Notes (Signed)
Patient arrives to ED POV c/o Migraines x2 weeks. Per pt she has been taking prescribed medication for migraines but it has been causing insomnia. Pt has been taking ibuprofen with no relief. Pt states dizziness,blurry vision and sensitivity to light. No other complaints at this time. Pt A/O x4.

## 2022-08-15 ENCOUNTER — Emergency Department (HOSPITAL_BASED_OUTPATIENT_CLINIC_OR_DEPARTMENT_OTHER)
Admission: EM | Admit: 2022-08-15 | Discharge: 2022-08-15 | Discharge status: Against Medical Advice | Payer: Medicaid Other | Attending: Emergency Medicine | Admitting: Emergency Medicine

## 2022-09-25 ENCOUNTER — Other Ambulatory Visit: Payer: Self-pay | Admitting: Student

## 2022-09-25 DIAGNOSIS — R519 Headache, unspecified: Secondary | ICD-10-CM

## 2022-10-08 ENCOUNTER — Encounter: Payer: Self-pay | Admitting: Neurology

## 2022-10-11 ENCOUNTER — Inpatient Hospital Stay: Admission: RE | Admit: 2022-10-11 | Payer: Medicaid Other | Source: Ambulatory Visit
